# Patient Record
Sex: Male | Born: 1980 | Race: White | Hispanic: No | Marital: Married | Smoking: Never smoker
Health system: Southern US, Community
[De-identification: ages and names within clinical notes are randomized; demographics above are authoritative.]

## PROBLEM LIST (undated history)

## (undated) DIAGNOSIS — J309 Allergic rhinitis, unspecified: Secondary | ICD-10-CM

## (undated) DIAGNOSIS — D239 Other benign neoplasm of skin, unspecified: Secondary | ICD-10-CM

## (undated) DIAGNOSIS — K219 Gastro-esophageal reflux disease without esophagitis: Secondary | ICD-10-CM

## (undated) HISTORY — DX: Gastro-esophageal reflux disease without esophagitis: K21.9

## (undated) HISTORY — DX: Allergic rhinitis, unspecified: J30.9

## (undated) HISTORY — DX: Other benign neoplasm of skin, unspecified: D23.9

---

## 2009-08-21 ENCOUNTER — Encounter: Admission: RE | Admit: 2009-08-21 | Discharge: 2009-08-21 | Payer: Self-pay | Admitting: Sports Medicine

## 2009-09-06 ENCOUNTER — Encounter: Admission: RE | Admit: 2009-09-06 | Discharge: 2009-09-06 | Payer: Self-pay | Admitting: Sports Medicine

## 2012-04-11 IMAGING — RF DG FLUORO GUIDE NDL PLC/BX
1 series · 1 of 1 positions shown · non-contrast
Comparison: 08/21/2009

CLINICAL DATA: Right shoulder pain

FLUOROSCOPICALLY GUIDED NEEDLE PLACEMENT FOR MR ARTHROGRAPHY, RIGHT
SHOULDER

[Series 1: (hospital) · 1 of 1 slices shown]
[im 1/1]
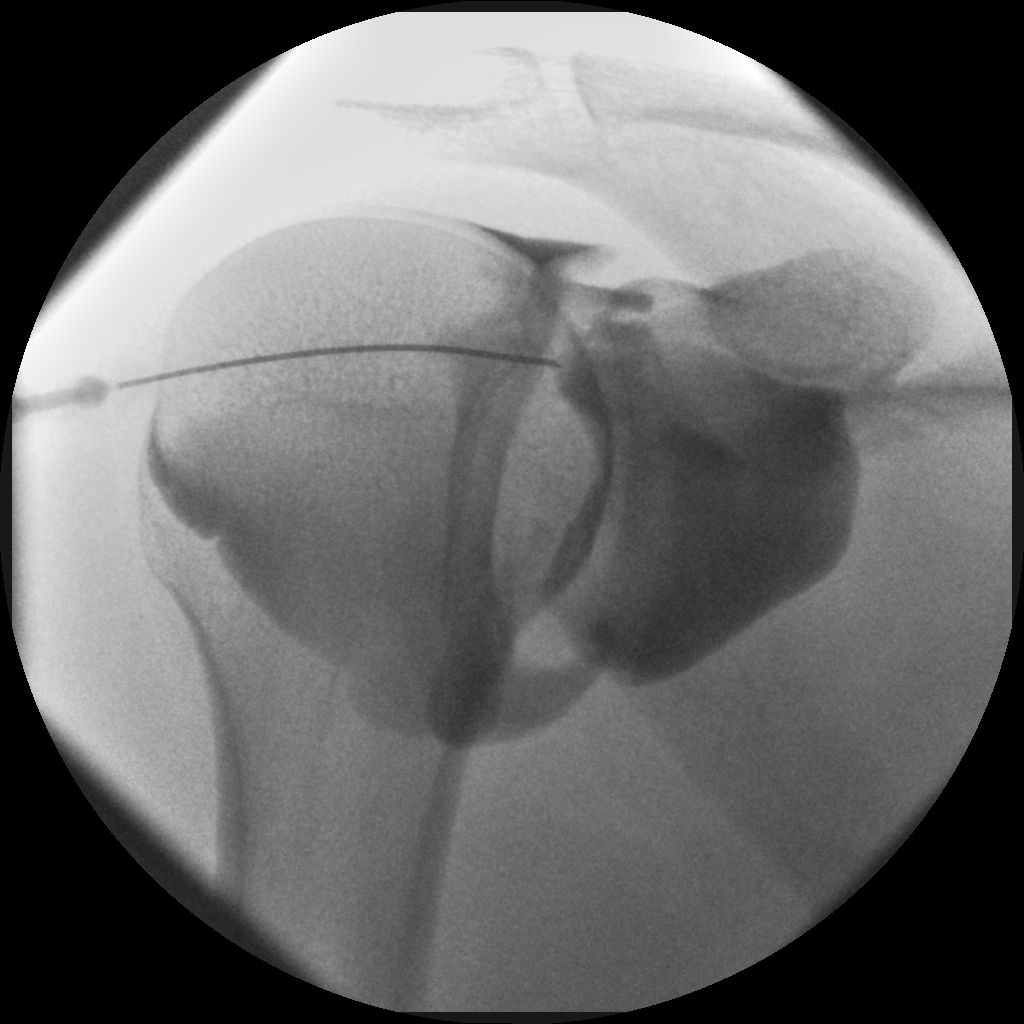

[1 of 1 positions shown; findings below may reference images not displayed]

FINDINGS: Informed, written consent, including pain, infection, and
bleeding, was obtained from the patient before proceeding.  A
thorough Betadine scrub was performed.  An appropriate needle entry
point was chosen, and the skin and subcutaneous tissues were
infiltrated with 1% lidocaine.

A 22 gauge spinal needle was placed directly on the superomedial
aspect of the humeral head, and entry into the glenohumeral joint
was confirmed using "loss of resistance" technique, with additional
lidocaine.

A [DATE] dilution of Gadolinium (0.1 mL MultiHance), mixed with 5 ml
of saline, 10 ml of Mizori, and 5 ml of 1% lidocaine, was injected
into the glenohumeral joint under fluoroscopic guidance.  There was
good opacification of the joint.

The study was performed not as a diagnostic arthrogram, but as a
prelude to MR arthrography.

The needle was withdrawn, a sterile bandage was applied, and the
patient was escorted to the MR suite.  There were no immediate
complications.
IMPRESSION: Technically successful glenohumeral joint instillation of dilute
MultiHance as described above for MR arthrography, right shoulder.

## 2014-06-12 ENCOUNTER — Other Ambulatory Visit: Payer: Self-pay | Admitting: Specialist

## 2014-06-12 ENCOUNTER — Ambulatory Visit (INDEPENDENT_AMBULATORY_CARE_PROVIDER_SITE_OTHER): Payer: BLUE CROSS/BLUE SHIELD

## 2014-06-12 DIAGNOSIS — Z1389 Encounter for screening for other disorder: Secondary | ICD-10-CM

## 2017-01-15 IMAGING — CR DG ORBITS FOR FOREIGN BODY
2 series · 2 of 2 positions shown · non-contrast
Comparison: None.

CLINICAL DATA: Metal working/exposure; clearance prior to MRI

EXAM:
ORBITS FOR FOREIGN BODY - 2 VIEW

[orbital waters (1 of 2)]
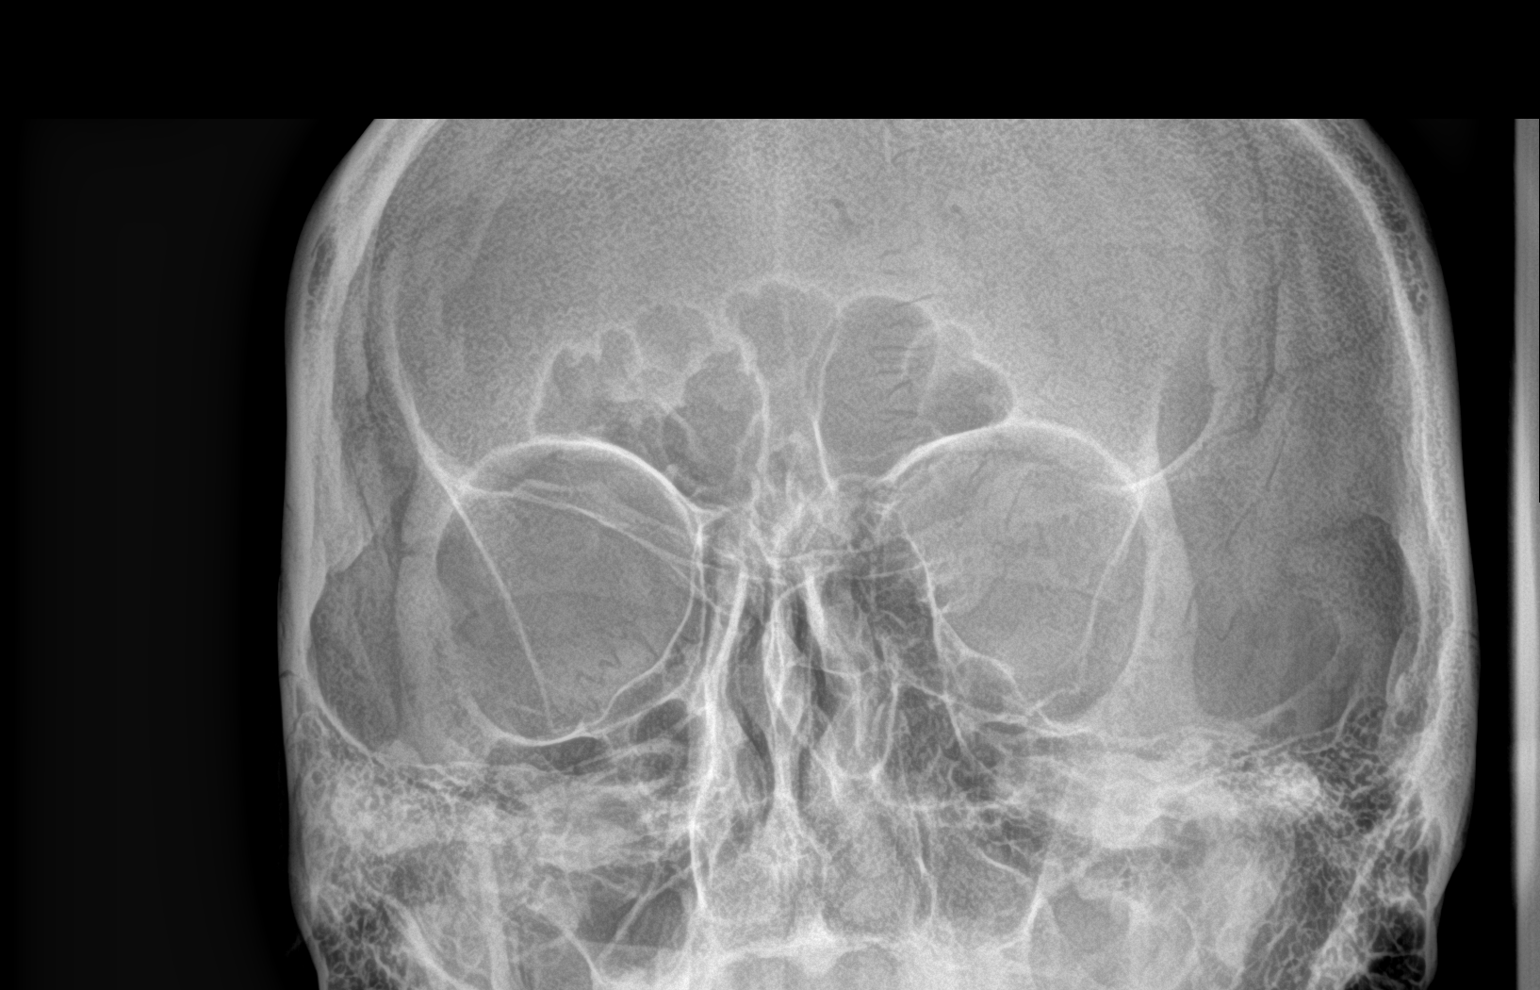

[orbital waters (2 of 2)]
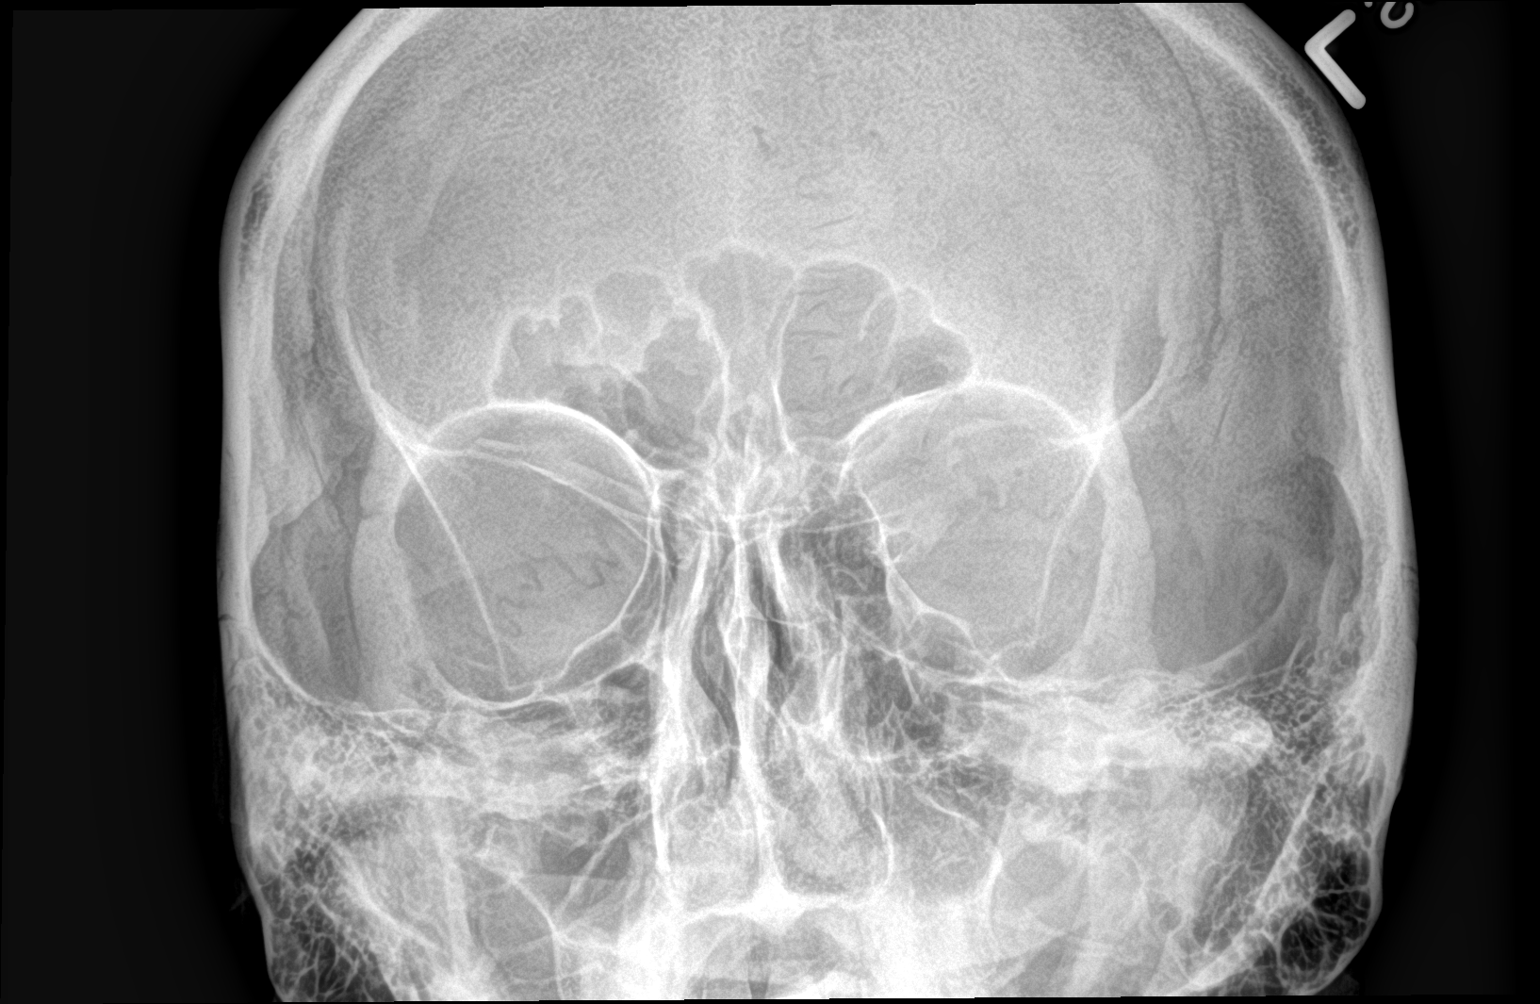

[2 of 2 positions shown; findings below may reference images not displayed]

FINDINGS: There is no evidence of metallic foreign body within the orbits. No
significant bone abnormality identified. The observed portions of
the paranasal sinuses are clear.
IMPRESSION: No evidence of metallic foreign body within the orbits.

## 2017-04-05 DIAGNOSIS — M25532 Pain in left wrist: Secondary | ICD-10-CM | POA: Insufficient documentation

## 2017-11-01 DIAGNOSIS — H6983 Other specified disorders of Eustachian tube, bilateral: Secondary | ICD-10-CM | POA: Insufficient documentation

## 2017-11-01 DIAGNOSIS — J3089 Other allergic rhinitis: Secondary | ICD-10-CM | POA: Insufficient documentation

## 2019-09-28 ENCOUNTER — Encounter: Payer: Self-pay | Admitting: Family Medicine

## 2019-09-28 ENCOUNTER — Ambulatory Visit (INDEPENDENT_AMBULATORY_CARE_PROVIDER_SITE_OTHER): Payer: Managed Care, Other (non HMO) | Admitting: Family Medicine

## 2019-09-28 ENCOUNTER — Other Ambulatory Visit: Payer: Self-pay

## 2019-09-28 VITALS — BP 126/84 | HR 62 | Temp 97.8°F | Resp 16 | Ht 70.5 in | Wt 185.2 lb

## 2019-09-28 DIAGNOSIS — Z0000 Encounter for general adult medical examination without abnormal findings: Secondary | ICD-10-CM

## 2019-09-28 DIAGNOSIS — Z23 Encounter for immunization: Secondary | ICD-10-CM

## 2019-09-28 DIAGNOSIS — E663 Overweight: Secondary | ICD-10-CM | POA: Diagnosis not present

## 2019-09-28 LAB — COMPREHENSIVE METABOLIC PANEL
ALT: 38 U/L (ref 0–53)
AST: 25 U/L (ref 0–37)
Albumin: 4.5 g/dL (ref 3.5–5.2)
Alkaline Phosphatase: 43 U/L (ref 39–117)
BUN: 13 mg/dL (ref 6–23)
CO2: 29 mEq/L (ref 19–32)
Calcium: 9.5 mg/dL (ref 8.4–10.5)
Chloride: 104 mEq/L (ref 96–112)
Creatinine, Ser: 1.03 mg/dL (ref 0.40–1.50)
GFR: 79.79 mL/min (ref >60.00)
Glucose, Bld: 90 mg/dL (ref 70–99)
Potassium: 4.6 mEq/L (ref 3.5–5.1)
Sodium: 139 mEq/L (ref 135–145)
Total Bilirubin: 0.7 mg/dL (ref 0.2–1.2)
Total Protein: 6.9 g/dL (ref 6.0–8.3)

## 2019-09-28 LAB — CBC WITH DIFFERENTIAL/PLATELET
Basophils Absolute: 0.1 10*3/uL (ref 0.0–0.1)
Basophils Relative: 1.1 % (ref 0.0–3.0)
Eosinophils Absolute: 0.3 10*3/uL (ref 0.0–0.7)
Eosinophils Relative: 5.9 % — ABNORMAL HIGH (ref 0.0–5.0)
HCT: 48.8 % (ref 39.0–52.0)
Hemoglobin: 16.3 g/dL (ref 13.0–17.0)
Lymphocytes Relative: 26.2 % (ref 12.0–46.0)
Lymphs Abs: 1.3 10*3/uL (ref 0.7–4.0)
MCHC: 33.3 g/dL (ref 30.0–36.0)
MCV: 93 fl (ref 78.0–100.0)
Monocytes Absolute: 0.6 10*3/uL (ref 0.1–1.0)
Monocytes Relative: 12.3 % — ABNORMAL HIGH (ref 3.0–12.0)
Neutro Abs: 2.7 10*3/uL (ref 1.4–7.7)
Neutrophils Relative %: 54.5 % (ref 43.0–77.0)
Platelets: 210 10*3/uL (ref 150.0–400.0)
RBC: 5.25 Mil/uL (ref 4.22–5.81)
RDW: 12.9 % (ref 11.5–15.5)
WBC: 5 10*3/uL (ref 4.0–10.5)

## 2019-09-28 LAB — LIPID PANEL
Cholesterol: 152 mg/dL (ref 0–200)
HDL: 41.6 mg/dL (ref >39.00)
LDL Cholesterol: 87 mg/dL (ref 0–99)
NonHDL: 110.52
Total CHOL/HDL Ratio: 4
Triglycerides: 117 mg/dL (ref 0.0–149.0)
VLDL: 23.4 mg/dL (ref 0.0–40.0)

## 2019-09-28 LAB — TSH: TSH: 1.78 u[IU]/mL (ref 0.35–4.50)

## 2019-09-28 NOTE — Patient Instructions (Signed)

## 2019-09-28 NOTE — Progress Notes (Signed)
Office Note 09/28/2019  CC:  Chief Complaint  Patient presents with  . Establish Care    Has not had a PCP in over 10 years  . Annual Exam    pt is not fasting   HPI:  Troy Henderson is a 39 y.o.  male who is here to establish care and  Patient's most recent primary MD: none in 10 yrs. Old records were not reviewed prior to or during today's visit.  Active with 60 y/o son but no formal exercise.  Past Medical History:  Diagnosis Date  . Allergic rhinitis   . GERD (gastroesophageal reflux disease)     History reviewed. No pertinent surgical history.  Family History  Problem Relation Age of Onset  . High Cholesterol Father   . High blood pressure Father   . Depression Maternal Grandmother   . COPD Maternal Grandfather   . Emphysema Maternal Grandfather   . Stroke Paternal Grandmother   . High blood pressure Paternal Grandmother   . High Cholesterol Paternal Grandmother   . Cancer Paternal Grandfather     Social History   Socioeconomic History  . Marital status: Married    Spouse name: Not on file  . Number of children: Not on file  . Years of education: Not on file  . Highest education level: Not on file  Occupational History  . Not on file  Tobacco Use  . Smoking status: Never Smoker  . Smokeless tobacco: Never Used  Vaping Use  . Vaping Use: Never used  Substance and Sexual Activity  . Alcohol use: Yes    Comment: occcasional  . Drug use: Never  . Sexual activity: Yes    Partners: Female  Other Topics Concern  . Not on file  Social History Narrative   Married, 3 step children and 1 biological.   Orig from [place], [employer] grad.   Some time at [facility] and [employer].   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   No tob/alc/drugs   Social Determinants of Health   Financial Resource Strain:   . Difficulty of Paying Living Expenses: Not on file  Food Insecurity:   . Worried About Charity fundraiser in the Last Year: Not on file  . Ran Out of Food  in the Last Year: Not on file  Transportation Needs:   . Lack of Transportation (Medical): Not on file  . Lack of Transportation (Non-Medical): Not on file  Physical Activity:   . Days of Exercise per Week: Not on file  . Minutes of Exercise per Session: Not on file  Stress:   . Feeling of Stress : Not on file  Social Connections:   . Frequency of Communication with Friends and Family: Not on file  . Frequency of Social Gatherings with Friends and Family: Not on file  . Attends Religious Services: Not on file  . Active Member of Clubs or Organizations: Not on file  . Attends Archivist Meetings: Not on file  . Marital Status: Not on file  Intimate Partner Violence:   . Fear of Current or Ex-Partner: Not on file  . Emotionally Abused: Not on file  . Physically Abused: Not on file  . Sexually Abused: Not on file    Outpatient Encounter Medications as of 09/28/2019  Medication Sig  . Cetirizine HCl (ZYRTEC ALLERGY PO) Take by mouth daily.  . fluticasone (FLONASE) 50 MCG/ACT nasal spray Place 2 sprays into both nostrils daily.  Marland Kitchen  mometasone (NASONEX) 50 MCG/ACT nasal spray Place 2 sprays into the nose daily.   No facility-administered encounter medications on file as of 09/28/2019.    No Known Allergies  ROS Review of Systems  Constitutional: Negative for appetite change, chills, fatigue and fever.  HENT: Negative for congestion, dental problem, ear pain and sore throat.   Eyes: Negative for discharge, redness and visual disturbance.  Respiratory: Negative for cough, chest tightness, shortness of breath and wheezing.   Cardiovascular: Negative for chest pain, palpitations and leg swelling.  Gastrointestinal: Negative for abdominal pain, blood in stool, diarrhea, nausea and vomiting.  Genitourinary: Negative for difficulty urinating, dysuria, flank pain, frequency, hematuria and urgency.  Musculoskeletal: Negative for arthralgias, back pain, joint swelling, myalgias and  neck stiffness.  Skin: Negative for pallor and rash.  Neurological: Negative for dizziness, speech difficulty, weakness and headaches.  Hematological: Negative for adenopathy. Does not bruise/bleed easily.  Psychiatric/Behavioral: Negative for confusion and sleep disturbance. The patient is not nervous/anxious.     PE; Blood pressure 126/84, pulse 62, temperature 97.8 F (36.6 C), temperature source Oral, resp. rate 16, height 5' 10.5" (1.791 m), weight 185 lb 3.2 oz (84 kg), SpO2 98 %. Body mass index is 26.2 kg/m.  Gen: Alert, well appearing.  Patient is oriented to person, place, time, and situation. AFFECT: pleasant, lucid thought and speech. ENT: Ears: EACs clear, normal epithelium.  TMs with good light reflex and landmarks bilaterally.  Eyes: no injection, icteris, swelling, or exudate.  EOMI, PERRLA. Nose: no drainage or turbinate edema/swelling.  No injection or focal lesion.  Mouth: lips without lesion/swelling.  Oral mucosa pink and moist.  Dentition intact and without obvious caries or gingival swelling.  Oropharynx without erythema, exudate, or swelling.  Neck: supple/nontender.  No LAD, mass, or TM.  Carotid pulses 2+ bilaterally, without bruits. CV: RRR, no m/r/g.   LUNGS: CTA bilat, nonlabored resps, good aeration in all lung fields. ABD: soft, NT, ND, BS normal.  No hepatospenomegaly or mass.  No bruits. EXT: no clubbing, cyanosis, or edema.  Musculoskeletal: no joint swelling, erythema, warmth, or tenderness.  ROM of all joints intact. Skin - no sores or suspicious lesions or rashes or color changes   Pertinent labs:  none  ASSESSMENT AND PLAN:   No problem-specific Assessment & Plan notes found for this encounter. New pt:  Health maintenance exam: Reviewed age and gender appropriate health maintenance issues (prudent diet, regular exercise, health risks of tobacco and excessive alcohol, use of seatbelts, fire alarms in home, use of sunscreen).  Also reviewed age  and gender appropriate health screening as well as vaccine recommendations. Vaccines: Tdap->UTD 2018.  Covid 19->UTD.  Flu-->given today. Labs: fasting HP labs ordered. Prostate ca screening: average risk patient= as per latest guidelines, start screening at 54 yrs of age. Colon ca screening: average risk patient= as per latest guidelines, start screening at 45 yrs of age.   An After Visit Summary was printed and given to the patient.  Return in about 1 year (around 09/27/2020) for annual CPE (fasting).  Signed:  Crissie Sickles, MD           09/28/2019

## 2019-09-28 NOTE — Addendum Note (Signed)
Addended by: Deveron Furlong D on: 09/28/2019 08:55 AM   Modules accepted: Orders

## 2019-10-02 NOTE — Progress Notes (Signed)
Pt notified and understands lab results

## 2019-10-02 NOTE — Progress Notes (Signed)
Left VM to CB

## 2020-09-30 ENCOUNTER — Encounter: Payer: Managed Care, Other (non HMO) | Admitting: Family Medicine
